# Patient Record
Sex: Female | Born: 1970 | Race: White | Hispanic: No | Marital: Married | Smoking: Never smoker
Health system: Southern US, Community
[De-identification: ages and names within clinical notes are randomized; demographics above are authoritative.]

---

## 1999-01-31 ENCOUNTER — Other Ambulatory Visit: Admission: RE | Admit: 1999-01-31 | Discharge: 1999-01-31 | Payer: Self-pay | Admitting: Obstetrics and Gynecology

## 1999-11-07 ENCOUNTER — Other Ambulatory Visit: Admission: RE | Admit: 1999-11-07 | Discharge: 1999-11-07 | Payer: Self-pay | Admitting: Obstetrics and Gynecology

## 2000-06-01 ENCOUNTER — Inpatient Hospital Stay (HOSPITAL_COMMUNITY): Admission: AD | Admit: 2000-06-01 | Discharge: 2000-06-03 | Payer: Self-pay | Admitting: Obstetrics and Gynecology

## 2000-07-17 ENCOUNTER — Other Ambulatory Visit: Admission: RE | Admit: 2000-07-17 | Discharge: 2000-07-17 | Payer: Self-pay | Admitting: Obstetrics and Gynecology

## 2001-08-10 ENCOUNTER — Other Ambulatory Visit: Admission: RE | Admit: 2001-08-10 | Discharge: 2001-08-10 | Payer: Self-pay | Admitting: Obstetrics and Gynecology

## 2002-08-26 ENCOUNTER — Other Ambulatory Visit: Admission: RE | Admit: 2002-08-26 | Discharge: 2002-08-26 | Payer: Self-pay | Admitting: Obstetrics and Gynecology

## 2003-09-21 ENCOUNTER — Other Ambulatory Visit: Admission: RE | Admit: 2003-09-21 | Discharge: 2003-09-21 | Payer: Self-pay | Admitting: Obstetrics and Gynecology

## 2004-12-04 ENCOUNTER — Other Ambulatory Visit: Admission: RE | Admit: 2004-12-04 | Discharge: 2004-12-04 | Payer: Self-pay | Admitting: Obstetrics and Gynecology

## 2006-01-08 ENCOUNTER — Other Ambulatory Visit: Admission: RE | Admit: 2006-01-08 | Discharge: 2006-01-08 | Payer: Self-pay | Admitting: Obstetrics and Gynecology

## 2006-08-11 ENCOUNTER — Ambulatory Visit (HOSPITAL_BASED_OUTPATIENT_CLINIC_OR_DEPARTMENT_OTHER): Admission: RE | Admit: 2006-08-11 | Discharge: 2006-08-11 | Payer: Self-pay | Admitting: Otolaryngology

## 2006-08-16 ENCOUNTER — Ambulatory Visit: Payer: Self-pay | Admitting: Internal Medicine

## 2007-03-24 ENCOUNTER — Ambulatory Visit: Payer: Self-pay | Admitting: Oncology

## 2009-10-12 ENCOUNTER — Ambulatory Visit: Payer: Self-pay | Admitting: Diagnostic Radiology

## 2009-10-12 ENCOUNTER — Emergency Department (HOSPITAL_BASED_OUTPATIENT_CLINIC_OR_DEPARTMENT_OTHER): Admission: EM | Admit: 2009-10-12 | Discharge: 2009-10-12 | Payer: Self-pay | Admitting: Emergency Medicine

## 2011-02-26 LAB — DIFFERENTIAL
Basophils Absolute: 0 10*3/uL (ref 0.0–0.1)
Eosinophils Absolute: 0.1 10*3/uL (ref 0.0–0.7)
Eosinophils Relative: 2 % (ref 0–5)
Lymphocytes Relative: 28 % (ref 12–46)

## 2011-02-26 LAB — BASIC METABOLIC PANEL
BUN: 25 mg/dL — ABNORMAL HIGH (ref 6–23)
CO2: 26 mEq/L (ref 19–32)
Calcium: 9.6 mg/dL (ref 8.4–10.5)
GFR calc Af Amer: >60 mL/min (ref >60)
Potassium: 3.5 mEq/L (ref 3.5–5.1)

## 2011-02-26 LAB — URINALYSIS, ROUTINE W REFLEX MICROSCOPIC
Glucose, UA: NEGATIVE mg/dL
Ketones, ur: NEGATIVE mg/dL
Leukocytes, UA: NEGATIVE
Protein, ur: NEGATIVE mg/dL

## 2011-02-26 LAB — URINE MICROSCOPIC-ADD ON

## 2011-02-26 LAB — POCT CARDIAC MARKERS
CKMB, poc: <1 ng/mL — ABNORMAL LOW (ref 1.0–8.0)
Myoglobin, poc: 67.6 ng/mL (ref 12–200)
Troponin i, poc: <0.05 ng/mL (ref 0.00–0.09)

## 2011-02-26 LAB — CBC
MCHC: 34.8 g/dL (ref 30.0–36.0)
MCV: 92.3 fL (ref 78.0–100.0)

## 2011-04-11 NOTE — Procedures (Signed)
Rebecca Guerrero, Rebecca Guerrero                  ACCOUNT NO.:  [id number]   MEDICAL RECORD NO.:  [id number]          PATIENT TYPE:  OUT   LOCATION:  SLEEP CENTER                 FACILITY:  [facility]   PHYSICIAN:  Clinton D. Maple Hudson, MD, FCCP, FACPDATE OF BIRTH:  04-05-70   DATE OF STUDY:  08/11/2006                              NOCTURNAL POLYSOMNOGRAM   REFERRING PHYSICIAN:  Lucky Cowboy, MD   INDICATION FOR STUDY:  Hypersomnia with sleep apnea.   EPWORTH SLEEPINESS SCORE:  5/24.  BMI 30.  Weight 205 pounds.   HOME MEDICATIONS:  Zegerid.   SLEEP ARCHITECTURE:  Total sleep time 384 minutes with sleep efficiency 81%.  Stage I was 3%, stage II 66%, stages III and 15%, REM 15% of total sleep  time.  Sleep latency 8 minutes, REM latency 155 minutes, awake after sleep  onset 83 minutes, arousal index 12.  No bedtime medication was reported.   RESPIRATORY DATA:  Split study protocol.  Apnea/hypopnea index (HPI, RDI) 62  obstructive events per hour, indicating severe obstructive sleep  apnea/Hypopnea syndrome before CPAP.  This included 80 obstructive apneas  and 60 hypopneas before CPAP.  Events were not positional.  REM AHI 0 per  hour.  CPAP was titrated to 11 CWP AHI 1.3 per hour.  A small Respironics  Comfort Gel mask was used.   OXYGEN DATA:  Loud snoring with oxygen desaturation to a nadir of 89% before  CPAP.  After CPAP control, saturation held 96-97% on room air.   CARDIAC DATA:  Normal sinus rhythm.   MOVEMENTS/PARASOMNIA:  Frequent limb jerks, but very few were associated  with arousal or awakening, clinically insignificant.   IMPRESSION/RECOMMENDATION:  1. Severe obstructive sleep apnea/hypopnea syndrome, AHI 62 per hour, with      nonpositional events, loud snoring and oxygen desaturation to a nadir      of 89%.  2. Successful CPAP titration to 11 CWP, AHI 1.3 per hour.  A small      Respironics Comfort Gel mask was used with heated humidifier.      Clinton D. Maple Hudson, MD, [facility], FACP  Diplomate, Biomedical engineer of Sleep Medicine  Electronically Signed     CDY/MEDQ  D:  08/16/2006 11:25:39  T:  08/18/2006 13:00:56  Job:  [id number]

## 2011-06-13 ENCOUNTER — Encounter: Payer: BC Managed Care – PPO | Admitting: Genetic Counselor

## 2013-06-15 ENCOUNTER — Other Ambulatory Visit: Payer: Self-pay | Admitting: Obstetrics and Gynecology

## 2013-06-15 DIAGNOSIS — R928 Other abnormal and inconclusive findings on diagnostic imaging of breast: Secondary | ICD-10-CM

## 2013-06-22 ENCOUNTER — Other Ambulatory Visit: Payer: Self-pay | Admitting: Obstetrics and Gynecology

## 2013-06-22 DIAGNOSIS — Z803 Family history of malignant neoplasm of breast: Secondary | ICD-10-CM

## 2013-06-30 ENCOUNTER — Ambulatory Visit
Admission: RE | Admit: 2013-06-30 | Discharge: 2013-06-30 | Disposition: A | Discharge status: Home / Self Care | Payer: BC Managed Care – PPO | Source: Ambulatory Visit | Attending: Obstetrics and Gynecology | Admitting: Obstetrics and Gynecology

## 2013-06-30 ENCOUNTER — Other Ambulatory Visit: Payer: BC Managed Care – PPO

## 2013-06-30 DIAGNOSIS — R928 Other abnormal and inconclusive findings on diagnostic imaging of breast: Secondary | ICD-10-CM

## 2013-07-10 ENCOUNTER — Ambulatory Visit
Admission: RE | Admit: 2013-07-10 | Discharge: 2013-07-10 | Disposition: A | Discharge status: Home / Self Care | Payer: BC Managed Care – PPO | Source: Ambulatory Visit | Attending: Obstetrics and Gynecology | Admitting: Obstetrics and Gynecology

## 2013-07-10 ENCOUNTER — Other Ambulatory Visit: Payer: BC Managed Care – PPO

## 2013-07-10 DIAGNOSIS — Z803 Family history of malignant neoplasm of breast: Secondary | ICD-10-CM

## 2013-07-10 MED ORDER — GADOBENATE DIMEGLUMINE 529 MG/ML IV SOLN
20.0000 mL | Freq: Once | INTRAVENOUS | Status: AC | PRN
  Administered 2013-07-10: 20 mL via INTRAVENOUS

## 2013-07-12 ENCOUNTER — Other Ambulatory Visit: Payer: Self-pay | Admitting: Obstetrics and Gynecology

## 2013-07-12 DIAGNOSIS — R928 Other abnormal and inconclusive findings on diagnostic imaging of breast: Secondary | ICD-10-CM

## 2013-07-18 ENCOUNTER — Other Ambulatory Visit: Payer: Self-pay | Admitting: Obstetrics and Gynecology

## 2013-07-18 ENCOUNTER — Ambulatory Visit
Admission: RE | Admit: 2013-07-18 | Discharge: 2013-07-18 | Disposition: A | Discharge status: Home / Self Care | Payer: BC Managed Care – PPO | Source: Ambulatory Visit | Attending: Obstetrics and Gynecology | Admitting: Obstetrics and Gynecology

## 2013-07-18 DIAGNOSIS — R928 Other abnormal and inconclusive findings on diagnostic imaging of breast: Secondary | ICD-10-CM

## 2013-07-20 ENCOUNTER — Other Ambulatory Visit: Payer: Self-pay | Admitting: Obstetrics and Gynecology

## 2013-07-20 ENCOUNTER — Ambulatory Visit
Admission: RE | Admit: 2013-07-20 | Discharge: 2013-07-20 | Disposition: A | Discharge status: Home / Self Care | Payer: BC Managed Care – PPO | Source: Ambulatory Visit | Attending: Obstetrics and Gynecology | Admitting: Obstetrics and Gynecology

## 2013-07-20 ENCOUNTER — Other Ambulatory Visit (HOSPITAL_COMMUNITY): Payer: Self-pay | Admitting: Radiology

## 2013-07-20 DIAGNOSIS — R928 Other abnormal and inconclusive findings on diagnostic imaging of breast: Secondary | ICD-10-CM

## 2013-07-28 ENCOUNTER — Other Ambulatory Visit: Payer: BC Managed Care – PPO

## 2014-11-17 ENCOUNTER — Emergency Department (HOSPITAL_BASED_OUTPATIENT_CLINIC_OR_DEPARTMENT_OTHER)
Admission: EM | Admit: 2014-11-17 | Discharge: 2014-11-17 | Disposition: A | Discharge status: Home / Self Care | Payer: BC Managed Care – PPO | Attending: Emergency Medicine | Admitting: Emergency Medicine

## 2014-11-17 ENCOUNTER — Encounter (HOSPITAL_BASED_OUTPATIENT_CLINIC_OR_DEPARTMENT_OTHER): Payer: Self-pay | Admitting: Emergency Medicine

## 2014-11-17 DIAGNOSIS — R350 Frequency of micturition: Secondary | ICD-10-CM | POA: Diagnosis present

## 2014-11-17 DIAGNOSIS — N390 Urinary tract infection, site not specified: Secondary | ICD-10-CM | POA: Diagnosis not present

## 2014-11-17 DIAGNOSIS — Z3202 Encounter for pregnancy test, result negative: Secondary | ICD-10-CM | POA: Insufficient documentation

## 2014-11-17 LAB — URINALYSIS, ROUTINE W REFLEX MICROSCOPIC
BILIRUBIN URINE: NEGATIVE
Glucose, UA: NEGATIVE mg/dL
KETONES UR: NEGATIVE mg/dL
NITRITE: NEGATIVE
PH: 5.5 (ref 5.0–8.0)
Protein, ur: 100 mg/dL — AB
SPECIFIC GRAVITY, URINE: 1.03 (ref 1.005–1.030)
UROBILINOGEN UA: 0.2 mg/dL (ref 0.0–1.0)

## 2014-11-17 LAB — URINE MICROSCOPIC-ADD ON

## 2014-11-17 LAB — PREGNANCY, URINE: Preg Test, Ur: NEGATIVE

## 2014-11-17 MED ORDER — LIDOCAINE HCL (PF) 1 % IJ SOLN
INTRAMUSCULAR | Status: AC
  Administered 2014-11-17: 2 mL
  Filled 2014-11-17: qty 5

## 2014-11-17 MED ORDER — CEFTRIAXONE SODIUM 1 G IJ SOLR
1.0000 g | Freq: Once | INTRAMUSCULAR | Status: AC
  Administered 2014-11-17: 1 g via INTRAMUSCULAR
  Filled 2014-11-17: qty 10

## 2014-11-17 MED ORDER — CEPHALEXIN 500 MG PO CAPS: 500.0000 mg | ORAL_CAPSULE | Freq: Four times a day (QID) | ORAL | Status: DC

## 2014-11-17 NOTE — ED Notes (Signed)
MD at bedside discussing results with patient 

## 2014-11-17 NOTE — ED Notes (Signed)
Patient states that she woke up at 5 am with bladder spasms and urinary frequency

## 2014-11-17 NOTE — Discharge Instructions (Signed)
Take keflex four times a day for a week.   Take tylenol, motrin for pain.   Stay hydrated.   Follow up with your doctor.   Return to ER if you have severe pain, vomiting, fevers.

## 2014-11-17 NOTE — ED Provider Notes (Signed)
CSN: [id number]     Arrival date & time 11/17/14  0645 History   First MD Initiated Contact with Patient 11/17/14 [phone]     Chief Complaint  Patient presents with  . Urinary Frequency     (Consider location/radiation/quality/duration/timing/severity/associated sxs/prior Treatment) The history is provided by the patient.  DEJAI SCHUBACH is a 43 y.o. female here with bladder spasms and urinary frequency. Woke up this morning around 5am with urinary frequency. Denies dysuria or fevers or flank pain. However, has spasms around her bladder area. Denies vomiting. Has hx of UTIs in the past.    History reviewed. No pertinent past medical history. History reviewed. No pertinent past surgical history. History reviewed. No pertinent family history. History  Substance Use Topics  . Smoking status: Never Smoker   . Smokeless tobacco: Not on file  . Alcohol Use: No   OB History    No data available     Review of Systems  Genitourinary: Positive for frequency.  All other systems reviewed and are negative.     Allergies  Codeine  Home Medications   Prior to Admission medications   Not on File   BP 140/77 mmHg  Pulse 82  Temp(Src) 98.4 F (36.9 C) (Oral)  Resp 16  Wt 214 lb (97.07 kg)  SpO2 100%  LMP 10/23/2014 (Exact Date) Physical Exam  Constitutional: She is oriented to person, place, and time.  Slightly uncomfortable   HENT:  Head: Normocephalic.  Mouth/Throat: Oropharynx is clear and moist.  Eyes: Conjunctivae are normal. Pupils are equal, round, and reactive to light.  Neck: Normal range of motion. Neck supple.  Cardiovascular: Normal rate, regular rhythm and normal heart sounds.   Pulmonary/Chest: Effort normal and breath sounds normal. No respiratory distress. She has no wheezes. She has no rales.  Abdominal: Soft. Bowel sounds are normal.  Mild suprapubic tenderness, no rebound   Musculoskeletal: Normal range of motion. She exhibits no edema or tenderness.   Neurological: She is alert and oriented to person, place, and time. No cranial nerve deficit. Coordination normal.  Skin: Skin is warm and dry.  Psychiatric: She has a normal mood and affect. Her behavior is normal. Judgment and thought content normal.  Nursing note and vitals reviewed.   ED Course  Procedures (including critical care time) Labs Review Labs Reviewed  URINALYSIS, ROUTINE W REFLEX MICROSCOPIC - Abnormal; Notable for the following:    APPearance TURBID (*)    Hgb urine dipstick LARGE (*)    Protein, ur 100 (*)    Leukocytes, UA LARGE (*)    All other components within normal limits  URINE MICROSCOPIC-ADD ON - Abnormal; Notable for the following:    Squamous Epithelial / LPF FEW (*)    Bacteria, UA MANY (*)    All other components within normal limits  PREGNANCY, URINE    Imaging Review No results found.   EKG Interpretation None      MDM   Final diagnoses:  None   Shalaina SARAHBETH CASHIN is a 43 y.o. female here with lower ab pain, dysuria. Likely UTI , no signs of pyelo and doesn't appear septic.   7:51 AM UA showed large leuks with many bacteria. Given ceftriaxone IM. Will d/c home with keflex.    Wandra Arthurs, MD 11/17/14 [phone]

## 2014-11-18 LAB — URINE CULTURE

## 2015-05-14 ENCOUNTER — Other Ambulatory Visit: Payer: Self-pay | Admitting: Obstetrics and Gynecology

## 2015-05-14 DIAGNOSIS — N63 Unspecified lump in unspecified breast: Secondary | ICD-10-CM

## 2015-05-18 ENCOUNTER — Ambulatory Visit
Admission: RE | Admit: 2015-05-18 | Discharge: 2015-05-18 | Disposition: A | Discharge status: Home / Self Care | Payer: BLUE CROSS/BLUE SHIELD | Source: Ambulatory Visit | Attending: Obstetrics and Gynecology | Admitting: Obstetrics and Gynecology

## 2015-05-18 DIAGNOSIS — N63 Unspecified lump in unspecified breast: Secondary | ICD-10-CM

## 2015-08-01 ENCOUNTER — Other Ambulatory Visit: Payer: Self-pay | Admitting: Obstetrics and Gynecology

## 2015-08-02 LAB — CYTOLOGY - PAP

## 2015-08-08 ENCOUNTER — Other Ambulatory Visit: Payer: Self-pay | Admitting: Obstetrics and Gynecology

## 2016-01-07 DIAGNOSIS — F419 Anxiety disorder, unspecified: Secondary | ICD-10-CM | POA: Insufficient documentation

## 2016-01-07 DIAGNOSIS — F411 Generalized anxiety disorder: Secondary | ICD-10-CM | POA: Insufficient documentation

## 2016-01-07 DIAGNOSIS — R3129 Other microscopic hematuria: Secondary | ICD-10-CM | POA: Insufficient documentation

## 2016-01-07 DIAGNOSIS — C44311 Basal cell carcinoma of skin of nose: Secondary | ICD-10-CM | POA: Insufficient documentation

## 2016-08-29 ENCOUNTER — Other Ambulatory Visit: Payer: Self-pay | Admitting: Obstetrics and Gynecology

## 2016-08-29 DIAGNOSIS — Z803 Family history of malignant neoplasm of breast: Secondary | ICD-10-CM

## 2016-09-02 ENCOUNTER — Ambulatory Visit
Admission: RE | Admit: 2016-09-02 | Discharge: 2016-09-02 | Disposition: A | Discharge status: Home / Self Care | Payer: 59 | Source: Ambulatory Visit | Attending: Obstetrics and Gynecology | Admitting: Obstetrics and Gynecology

## 2016-09-02 DIAGNOSIS — Z803 Family history of malignant neoplasm of breast: Secondary | ICD-10-CM

## 2016-09-02 MED ORDER — GADOBENATE DIMEGLUMINE 529 MG/ML IV SOLN
20.0000 mL | Freq: Once | INTRAVENOUS | Status: AC | PRN
  Administered 2016-09-02: 20 mL via INTRAVENOUS

## 2016-10-21 DIAGNOSIS — I10 Essential (primary) hypertension: Secondary | ICD-10-CM | POA: Insufficient documentation

## 2016-10-21 DIAGNOSIS — R9431 Abnormal electrocardiogram [ECG] [EKG]: Secondary | ICD-10-CM | POA: Insufficient documentation

## 2017-10-08 ENCOUNTER — Other Ambulatory Visit: Payer: Self-pay | Admitting: Obstetrics and Gynecology

## 2017-10-08 DIAGNOSIS — R928 Other abnormal and inconclusive findings on diagnostic imaging of breast: Secondary | ICD-10-CM

## 2017-10-12 ENCOUNTER — Ambulatory Visit
Admission: RE | Admit: 2017-10-12 | Discharge: 2017-10-12 | Disposition: A | Discharge status: Home / Self Care | Payer: 59 | Source: Ambulatory Visit | Attending: Obstetrics and Gynecology | Admitting: Obstetrics and Gynecology

## 2017-10-12 DIAGNOSIS — R928 Other abnormal and inconclusive findings on diagnostic imaging of breast: Secondary | ICD-10-CM

## 2018-04-28 DIAGNOSIS — E6609 Other obesity due to excess calories: Secondary | ICD-10-CM | POA: Insufficient documentation

## 2018-04-28 DIAGNOSIS — R1319 Other dysphagia: Secondary | ICD-10-CM | POA: Insufficient documentation

## 2018-07-27 DIAGNOSIS — K2100 Gastro-esophageal reflux disease with esophagitis, without bleeding: Secondary | ICD-10-CM | POA: Insufficient documentation

## 2018-10-13 ENCOUNTER — Other Ambulatory Visit: Payer: Self-pay | Admitting: Obstetrics and Gynecology

## 2018-10-13 DIAGNOSIS — Z803 Family history of malignant neoplasm of breast: Secondary | ICD-10-CM

## 2018-11-05 ENCOUNTER — Ambulatory Visit
Admission: RE | Admit: 2018-11-05 | Discharge: 2018-11-05 | Disposition: A | Discharge status: Home / Self Care | Payer: 59 | Source: Ambulatory Visit | Attending: Obstetrics and Gynecology | Admitting: Obstetrics and Gynecology

## 2018-11-05 DIAGNOSIS — Z803 Family history of malignant neoplasm of breast: Secondary | ICD-10-CM

## 2018-11-05 MED ORDER — GADOBUTROL 1 MMOL/ML IV SOLN
10.0000 mL | Freq: Once | INTRAVENOUS | Status: AC | PRN
  Administered 2018-11-05: 10 mL via INTRAVENOUS

## 2018-11-10 ENCOUNTER — Other Ambulatory Visit: Payer: Self-pay | Admitting: Obstetrics and Gynecology

## 2018-11-10 DIAGNOSIS — K7689 Other specified diseases of liver: Secondary | ICD-10-CM

## 2018-11-11 ENCOUNTER — Ambulatory Visit
Admission: RE | Admit: 2018-11-11 | Discharge: 2018-11-11 | Disposition: A | Discharge status: Home / Self Care | Payer: 59 | Source: Ambulatory Visit | Attending: Obstetrics and Gynecology | Admitting: Obstetrics and Gynecology

## 2018-11-11 DIAGNOSIS — K7689 Other specified diseases of liver: Secondary | ICD-10-CM

## 2019-11-09 DIAGNOSIS — G43909 Migraine, unspecified, not intractable, without status migrainosus: Secondary | ICD-10-CM | POA: Insufficient documentation

## 2019-11-09 DIAGNOSIS — N390 Urinary tract infection, site not specified: Secondary | ICD-10-CM | POA: Insufficient documentation

## 2019-11-09 DIAGNOSIS — I10 Essential (primary) hypertension: Secondary | ICD-10-CM | POA: Insufficient documentation

## 2019-11-15 ENCOUNTER — Other Ambulatory Visit: Payer: Self-pay | Admitting: Obstetrics and Gynecology

## 2019-11-15 DIAGNOSIS — Z9189 Other specified personal risk factors, not elsewhere classified: Secondary | ICD-10-CM

## 2020-03-01 IMAGING — US US ABDOMEN LIMITED
1 series · 14 of 25 positions shown · non-contrast
Comparison: Breast MRI November 05, 2018 which included a portion
of the upper abdomen

CLINICAL DATA: Liver cyst

EXAM:
ULTRASOUND ABDOMEN LIMITED RIGHT UPPER QUADRANT

[Series 1: us abdomen limited · 0.30mm/px · 14 of 56 slices shown]
[im 1/56]
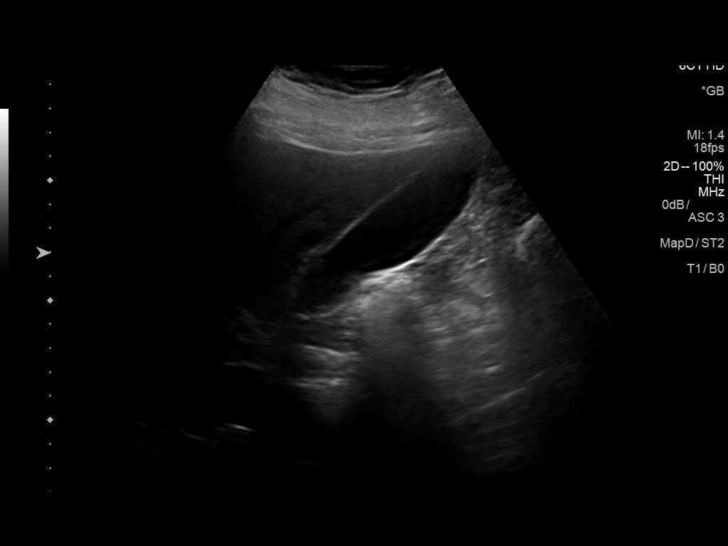
[im 5/56]
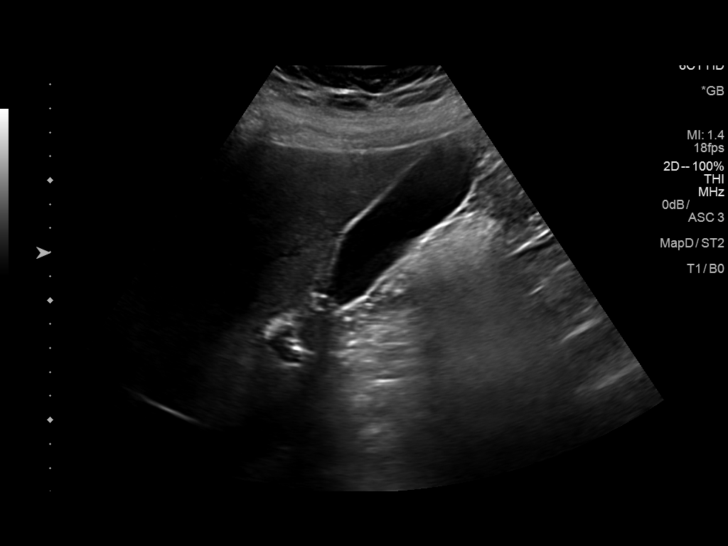
[im 10/56]
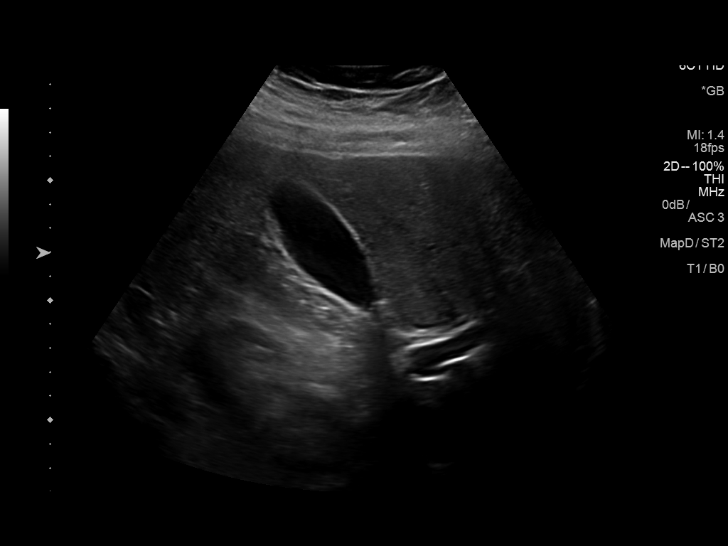
[im 14/56]
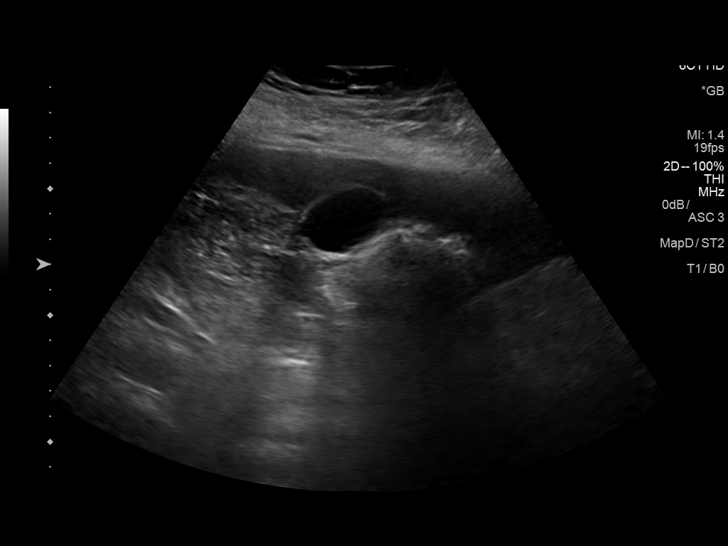
[im 19/56]
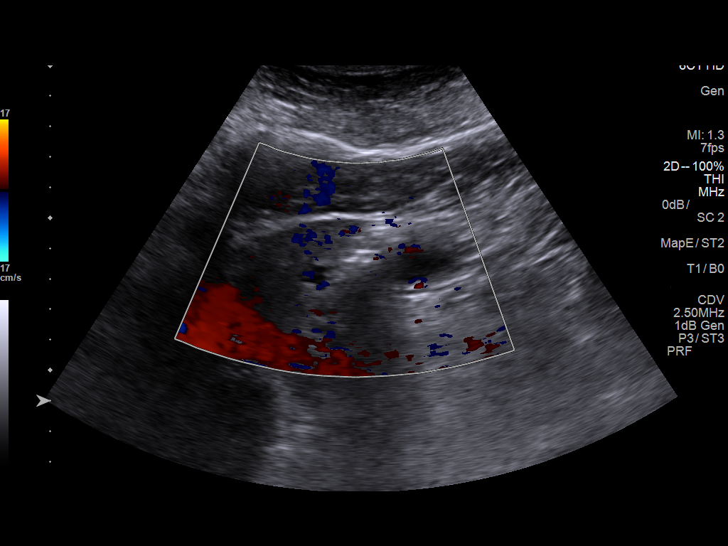
[im 21/56]
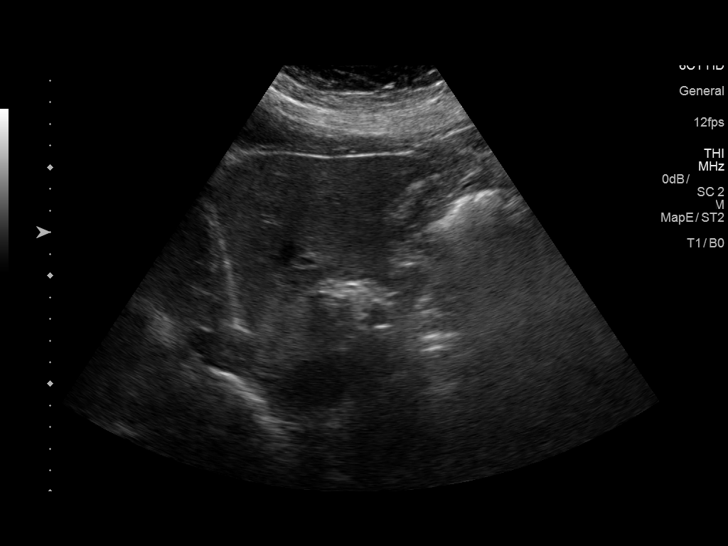
[im 26/56]
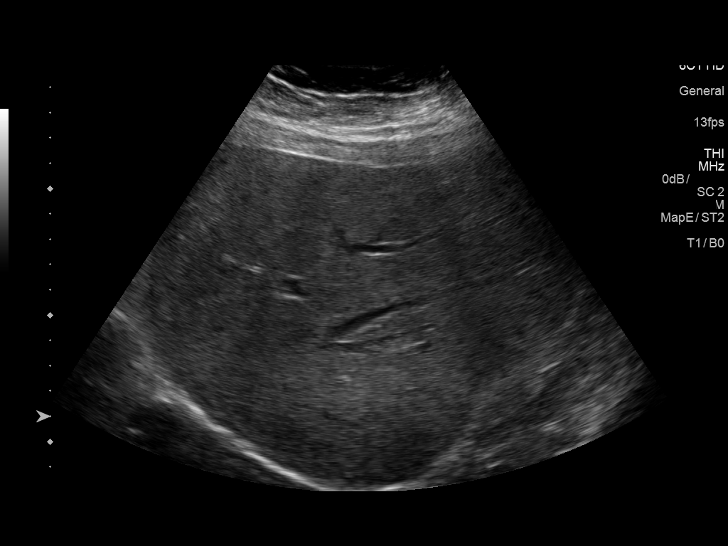
[im 30/56]
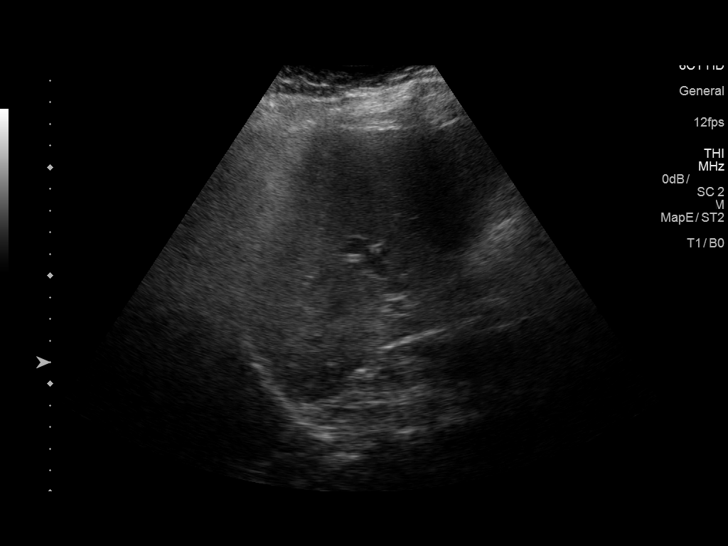
[im 35/56]
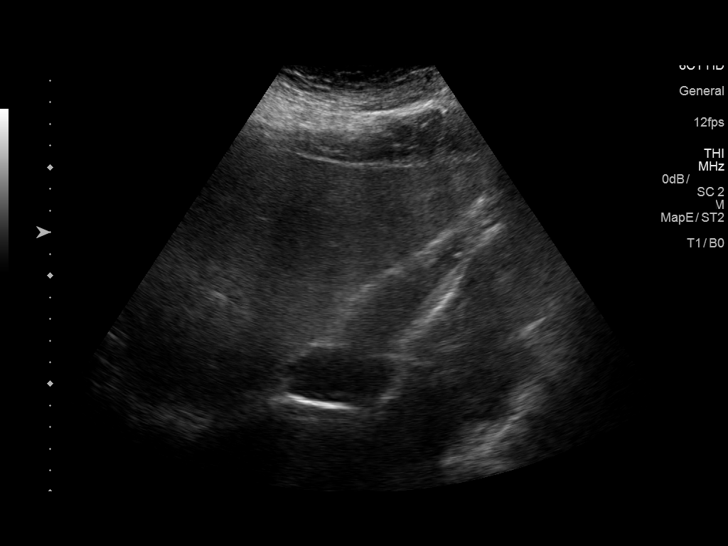
[im 37/56]
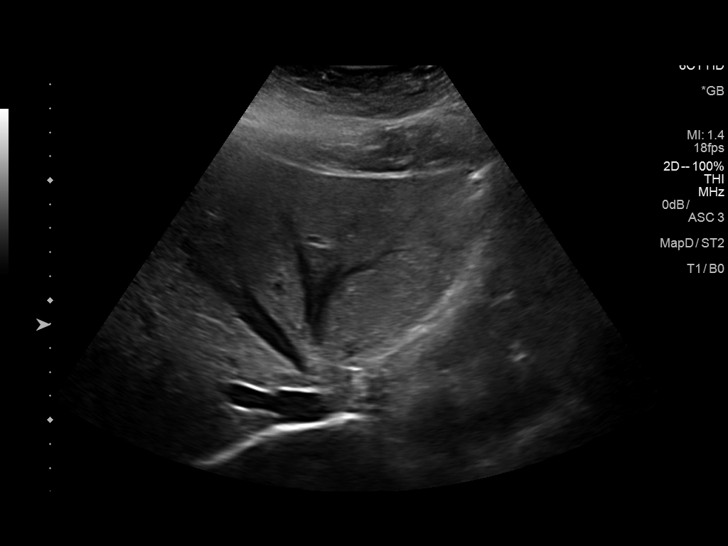
[im 42/56]
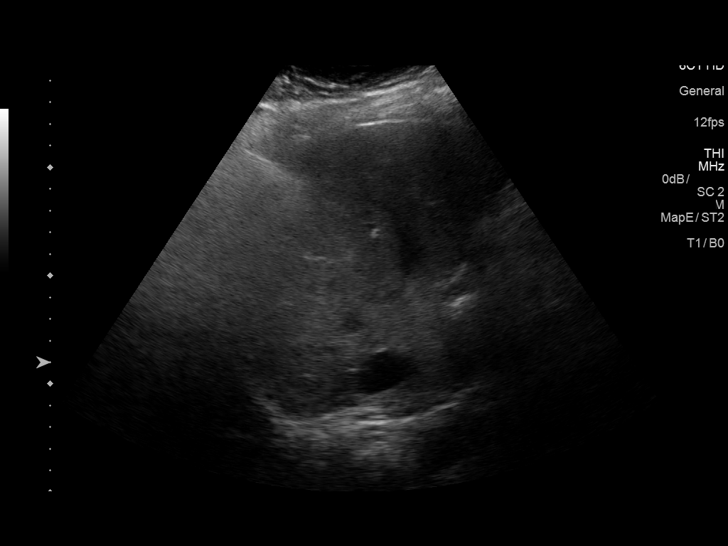
[im 46/56]
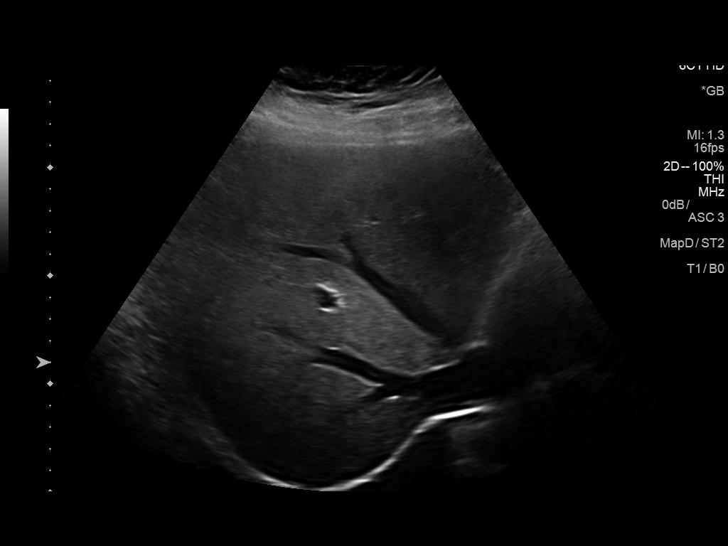
[im 51/56]
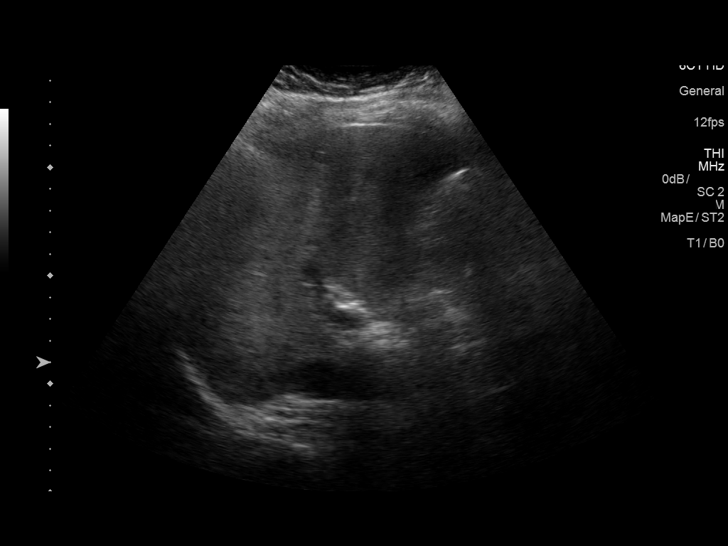
[im 56/56]
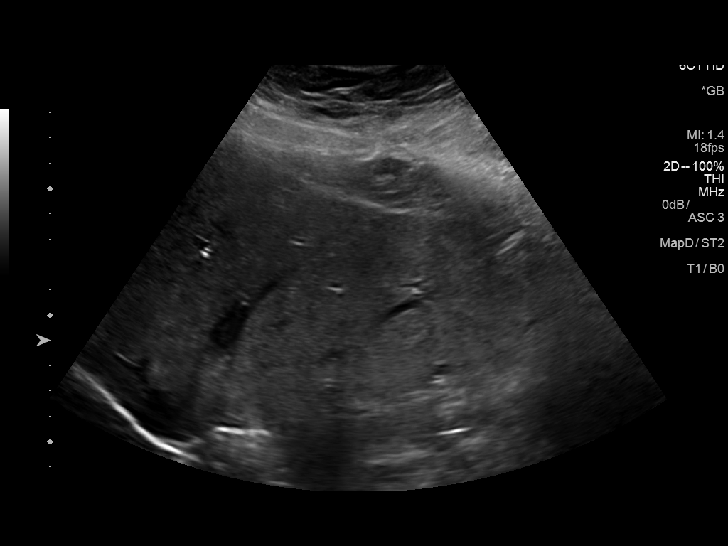

[14 of 25 positions shown; findings below may reference images not displayed]

FINDINGS: Gallbladder:

No gallstones or wall thickening visualized. There is no
pericholecystic fluid. No sonographic Murphy sign noted by
sonographer.

Common bile duct:

Diameter: 4 mm. No intrahepatic or extrahepatic biliary duct
dilatation.

Liver:

There is a cyst in the left lobe of the liver measuring 0.8 x 0.9 x
1.0 cm. No other focal liver lesions are appreciable. Within normal
limits in parenchymal echogenicity. Portal vein is patent on color
Doppler imaging with normal direction of blood flow towards the
liver.
IMPRESSION: Small benign cyst in the left lobe of the liver. Liver otherwise
appears unremarkable.

Study otherwise unremarkable.

## 2020-06-21 DIAGNOSIS — N951 Menopausal and female climacteric states: Secondary | ICD-10-CM | POA: Insufficient documentation

## 2020-09-20 DIAGNOSIS — F4320 Adjustment disorder, unspecified: Secondary | ICD-10-CM | POA: Insufficient documentation

## 2021-01-03 DIAGNOSIS — N390 Urinary tract infection, site not specified: Secondary | ICD-10-CM | POA: Insufficient documentation

## 2021-05-07 ENCOUNTER — Other Ambulatory Visit: Payer: Self-pay | Admitting: Obstetrics and Gynecology

## 2021-05-07 DIAGNOSIS — Z9189 Other specified personal risk factors, not elsewhere classified: Secondary | ICD-10-CM

## 2021-05-23 ENCOUNTER — Ambulatory Visit
Admission: RE | Admit: 2021-05-23 | Discharge: 2021-05-23 | Disposition: A | Discharge status: Home / Self Care | Payer: BC Managed Care – PPO | Source: Ambulatory Visit | Attending: Obstetrics and Gynecology | Admitting: Obstetrics and Gynecology

## 2021-05-23 ENCOUNTER — Other Ambulatory Visit: Payer: Self-pay

## 2021-05-23 DIAGNOSIS — Z9189 Other specified personal risk factors, not elsewhere classified: Secondary | ICD-10-CM

## 2021-05-23 MED ORDER — GADOBUTROL 1 MMOL/ML IV SOLN
10.0000 mL | Freq: Once | INTRAVENOUS | Status: AC | PRN
  Administered 2021-05-23: 10 mL via INTRAVENOUS

## 2021-07-16 DIAGNOSIS — G4733 Obstructive sleep apnea (adult) (pediatric): Secondary | ICD-10-CM | POA: Insufficient documentation

## 2022-01-15 DIAGNOSIS — M26629 Arthralgia of temporomandibular joint, unspecified side: Secondary | ICD-10-CM | POA: Insufficient documentation

## 2022-02-11 ENCOUNTER — Other Ambulatory Visit: Payer: Self-pay | Admitting: Obstetrics and Gynecology

## 2022-02-11 DIAGNOSIS — Z803 Family history of malignant neoplasm of breast: Secondary | ICD-10-CM

## 2022-07-04 DIAGNOSIS — D508 Other iron deficiency anemias: Secondary | ICD-10-CM | POA: Insufficient documentation

## 2022-07-04 DIAGNOSIS — R7301 Impaired fasting glucose: Secondary | ICD-10-CM | POA: Insufficient documentation

## 2022-12-17 ENCOUNTER — Inpatient Hospital Stay: Payer: No Typology Code available for payment source | Attending: Hematology & Oncology

## 2022-12-17 ENCOUNTER — Encounter: Payer: Self-pay | Admitting: Hematology & Oncology

## 2022-12-17 ENCOUNTER — Inpatient Hospital Stay: Payer: No Typology Code available for payment source | Admitting: Hematology & Oncology

## 2022-12-17 ENCOUNTER — Other Ambulatory Visit: Payer: Self-pay

## 2022-12-17 VITALS — BP 127/71 | HR 77 | Temp 98.3°F | Resp 18 | Ht 69.0 in | Wt 247.0 lb

## 2022-12-17 DIAGNOSIS — D500 Iron deficiency anemia secondary to blood loss (chronic): Secondary | ICD-10-CM

## 2022-12-17 DIAGNOSIS — D509 Iron deficiency anemia, unspecified: Secondary | ICD-10-CM | POA: Diagnosis not present

## 2022-12-17 LAB — CMP (CANCER CENTER ONLY)
ALT: 14 U/L (ref 0–44)
AST: 15 U/L (ref 15–41)
Albumin: 4.4 g/dL (ref 3.5–5.0)
Alkaline Phosphatase: 72 U/L (ref 38–126)
Anion gap: 9 (ref 5–15)
BUN: 23 mg/dL — ABNORMAL HIGH (ref 6–20)
CO2: 25 mmol/L (ref 22–32)
Calcium: 9.3 mg/dL (ref 8.9–10.3)
Chloride: 105 mmol/L (ref 98–111)
Creatinine: 1.04 mg/dL — ABNORMAL HIGH (ref 0.44–1.00)
GFR, Estimated: >60 mL/min (ref >60)
Glucose, Bld: 97 mg/dL (ref 70–99)
Potassium: 3.7 mmol/L (ref 3.5–5.1)
Sodium: 139 mmol/L (ref 135–145)
Total Bilirubin: 0.3 mg/dL (ref 0.3–1.2)
Total Protein: 6.4 g/dL — ABNORMAL LOW (ref 6.5–8.1)

## 2022-12-17 LAB — CBC WITH DIFFERENTIAL (CANCER CENTER ONLY)
Abs Immature Granulocytes: 0.07 10*3/uL (ref 0.00–0.07)
Basophils Absolute: 0 10*3/uL (ref 0.0–0.1)
Basophils Relative: 1 %
Eosinophils Absolute: 0.1 10*3/uL (ref 0.0–0.5)
Eosinophils Relative: 2 %
HCT: 34.9 % — ABNORMAL LOW (ref 36.0–46.0)
Hemoglobin: 11 g/dL — ABNORMAL LOW (ref 12.0–15.0)
Immature Granulocytes: 1 %
Lymphocytes Relative: 26 %
Lymphs Abs: 1.4 10*3/uL (ref 0.7–4.0)
MCH: 26.2 pg (ref 26.0–34.0)
MCHC: 31.5 g/dL (ref 30.0–36.0)
MCV: 83.1 fL (ref 80.0–100.0)
Monocytes Absolute: 0.3 10*3/uL (ref 0.1–1.0)
Monocytes Relative: 6 %
Neutro Abs: 3.6 10*3/uL (ref 1.7–7.7)
Neutrophils Relative %: 64 %
Platelet Count: 339 10*3/uL (ref 150–400)
RBC: 4.2 MIL/uL (ref 3.87–5.11)
RDW: 15.9 % — ABNORMAL HIGH (ref 11.5–15.5)
WBC Count: 5.6 10*3/uL (ref 4.0–10.5)
nRBC: 0 % (ref 0.0–0.2)

## 2022-12-17 LAB — RETICULOCYTES
Immature Retic Fract: 16.9 % — ABNORMAL HIGH (ref 2.3–15.9)
RBC.: 4.21 MIL/uL (ref 3.87–5.11)
Retic Count, Absolute: 66.5 10*3/uL (ref 19.0–186.0)
Retic Ct Pct: 1.6 % (ref 0.4–3.1)

## 2022-12-17 LAB — SAVE SMEAR(SSMR), FOR PROVIDER SLIDE REVIEW

## 2022-12-17 LAB — FERRITIN: Ferritin: 4 ng/mL — ABNORMAL LOW (ref 11–307)

## 2022-12-17 NOTE — Progress Notes (Signed)
Referral MD  Reason for Referral: Iron deficiency anemia-possible malabsorption versus bleeding  Chief Complaint  Patient presents with   New Patient (Initial Visit)  : My iron has been low.  HPI: Rebecca Guerrero is a very charming 52 year old white female.  She and I have a lot in common.  And she actually went to the same high school I did up in [place].  She graduated about 15 years after I did.  She and her husband now live in [place].  She has been followed at [facility].  She has had problems with anemia and iron deficiency.  She has been on chronic Prilosec for probably 8 years.  She still has her monthly cycles.  There is still quite regular.  The big news is that she is going expecting her first grandchild in July.  However, the daughter and her husband live in [place].  Hopefully, they will be able to go over there.  She has had multiple endoscopies and colonoscopies.  There have been anything that has been found that would suggest a malignancy.  I think she been tested for celiac disease.  This is also been unremarkable.  She has been taking oral iron.  She does not drink.  She does not smoke.  She really has had no surgeries.  She does not chew ice.  She does have little fatigue.  She has never had IV iron.  She is not a vegetarian.  Currently, I would say that her performance status is probably ECOG 1.   History reviewed. No pertinent past medical history.:  History reviewed. No pertinent surgical history.:   Current Outpatient Medications:    clonazePAM (KLONOPIN) 0.5 MG tablet, 0.5 mg daily., Disp: , Rfl:    estradiol (ESTRACE) 0.1 MG/GM vaginal cream, Place 1 Applicatorful vaginally. Every 3-4 days, Disp: , Rfl:    iron polysaccharides (NIFEREX) 150 MG capsule, Take 1 capsule by mouth every other day., Disp: , Rfl:    losartan-hydrochlorothiazide (HYZAAR) 50-12.5 MG tablet, Take 0.5 tablets by mouth 2 (two) times daily., Disp: , Rfl:    nitrofurantoin  (MACRODANTIN) 100 MG capsule, Take 100 mg by mouth. Monthly through the menstrual cycle, Disp: , Rfl:    omeprazole (PRILOSEC) 20 MG capsule, Take 20 mg by mouth daily., Disp: , Rfl:    sertraline (ZOLOFT) 50 MG tablet, Take 1 tablet by mouth daily., Disp: , Rfl: :  :   Allergies  Allergen Reactions   Codeine Other (See Comments)    Room spinning  :  History reviewed. No pertinent family history.:   Social History   Socioeconomic History   Marital status: Married    Spouse name: Not on file   Number of children: Not on file   Years of education: Not on file   Highest education level: Not on file  Occupational History   Not on file  Tobacco Use   Smoking status: Never   Smokeless tobacco: Not on file  Substance and Sexual Activity   Alcohol use: No   Drug use: No   Sexual activity: Not on file  Other Topics Concern   Not on file  Social History Narrative   Not on file   Social Determinants of Health   Financial Resource Strain: Not on file  Food Insecurity: Not on file  Transportation Needs: Not on file  Physical Activity: Not on file  Stress: Not on file  Social Connections: Not on file  Intimate Partner Violence: Not on file  :  Review of Systems  Constitutional:  Positive for malaise/fatigue.  HENT: Negative.    Eyes: Negative.   Respiratory: Negative.    Cardiovascular: Negative.   Gastrointestinal:  Positive for heartburn.  Genitourinary: Negative.   Musculoskeletal: Negative.   Skin: Negative.   Neurological: Negative.   Endo/Heme/Allergies: Negative.   Psychiatric/Behavioral: Negative.       Exam: Vital signs show temperature of 98.3.  Pulse 77.  Blood pressure 127/71.  Weight is 247 pounds.  '@IPVITALS'$ @ Physical Exam Vitals reviewed.  HENT:     Head: Normocephalic and atraumatic.  Eyes:     Pupils: Pupils are equal, round, and reactive to light.  Cardiovascular:     Rate and Rhythm: Normal rate and regular rhythm.     Heart sounds:  Normal heart sounds.  Pulmonary:     Effort: Pulmonary effort is normal.     Breath sounds: Normal breath sounds.  Abdominal:     General: Bowel sounds are normal.     Palpations: Abdomen is soft.  Musculoskeletal:        General: No tenderness or deformity. Normal range of motion.     Cervical back: Normal range of motion.  Lymphadenopathy:     Cervical: No cervical adenopathy.  Skin:    General: Skin is warm and dry.     Findings: No erythema or rash.  Neurological:     Mental Status: She is alert and oriented to person, place, and time.  Psychiatric:        Behavior: Behavior normal.        Thought Content: Thought content normal.        Judgment: Judgment normal.     Recent Labs    12/17/22 1359  WBC 5.6  HGB 11.0*  HCT 34.9*  PLT 339    Recent Labs    12/17/22 1359  NA 139  K 3.7  CL 105  CO2 25  GLUCOSE 97  BUN 23*  CREATININE 1.04*  CALCIUM 9.3    Blood smear review: She has known normochromic and normocytic red blood cells.  There is some anisocytosis and poikilocytosis.  There is no nucleated red blood cells.  I see no teardrop cells.  She has no target cells.  There is no rouleaux formation.  White blood cells.  Normal morphology maturation.  She has no immature myeloid or lymphoid cells.  There are no hypersegmented polys.  Platelets are adequate in number and size.  Pathology: None    Assessment and Plan: Rebecca Guerrero is a very charming 52 year old white female.  She has mild anemia.  She is little bit microcytic.  She has had low ferritin in the past.  Going back to 2021, her ferritin was only 6.  Just 2 months ago, the ferritin was 5.  I had to believe that part of the problem is that she is on Prilosec.  I think this is causing her to have achlorhydria and subsequently she does not absorb iron that she takes by mouth.  I think that she probably would respond to IV iron.  Hopefully, we can utilize Monoferric which would be very convenient for  her.  I do not think we need to do a bone marrow test.  Nothing on her blood smear looks like there is any underlying hematologic malignancy.  For right now, we will see what the iron studies look like.  She is incredibly delightful to talk to.  It was fun talking to her about [employer].  I likely will see her back in about a month.  I want to see patients back to get IV iron about a month later and we can see what changes are with her hematologic parameters.

## 2022-12-18 ENCOUNTER — Encounter: Payer: Self-pay | Admitting: Hematology & Oncology

## 2022-12-18 DIAGNOSIS — D500 Iron deficiency anemia secondary to blood loss (chronic): Secondary | ICD-10-CM | POA: Insufficient documentation

## 2022-12-18 LAB — IRON AND IRON BINDING CAPACITY (CC-WL,HP ONLY)
Iron: 66 ug/dL (ref 28–170)
Saturation Ratios: 15 % (ref 10.4–31.8)
TIBC: 441 ug/dL (ref 250–450)
UIBC: 375 ug/dL (ref 148–442)

## 2022-12-18 NOTE — Addendum Note (Signed)
Addended by: Volanda Napoleon on: 12/18/2022 05:14 PM   Modules accepted: Orders

## 2022-12-19 LAB — HGB FRACTIONATION CASCADE
Hgb A2: 2.6 % (ref 1.8–3.2)
Hgb A: 97.4 % (ref 96.4–98.8)
Hgb F: 0 % (ref 0.0–2.0)
Hgb S: 0 %

## 2022-12-19 LAB — ERYTHROPOIETIN: Erythropoietin: 37.2 m[IU]/mL — ABNORMAL HIGH (ref 2.6–18.5)

## 2022-12-22 ENCOUNTER — Other Ambulatory Visit: Payer: Self-pay | Admitting: Hematology & Oncology

## 2022-12-26 ENCOUNTER — Inpatient Hospital Stay: Payer: No Typology Code available for payment source | Attending: Hematology & Oncology

## 2022-12-26 VITALS — BP 115/67 | HR 63 | Temp 98.3°F | Resp 17

## 2022-12-26 DIAGNOSIS — D509 Iron deficiency anemia, unspecified: Secondary | ICD-10-CM | POA: Diagnosis present

## 2022-12-26 DIAGNOSIS — D500 Iron deficiency anemia secondary to blood loss (chronic): Secondary | ICD-10-CM

## 2022-12-26 MED ORDER — SODIUM CHLORIDE 0.9 % IV SOLN: Freq: Once | INTRAVENOUS | Status: AC

## 2022-12-26 MED ORDER — SODIUM CHLORIDE 0.9 % IV SOLN
300.0000 mg | Freq: Once | INTRAVENOUS | Status: AC
  Administered 2022-12-26: 300 mg via INTRAVENOUS
  Filled 2022-12-26: qty 300

## 2022-12-26 NOTE — Patient Instructions (Signed)

## 2023-01-02 ENCOUNTER — Inpatient Hospital Stay: Payer: No Typology Code available for payment source

## 2023-01-09 ENCOUNTER — Inpatient Hospital Stay: Payer: No Typology Code available for payment source

## 2023-01-09 VITALS — BP 112/70 | HR 64 | Temp 98.0°F | Resp 17 | Ht 69.0 in | Wt 245.0 lb

## 2023-01-09 DIAGNOSIS — D500 Iron deficiency anemia secondary to blood loss (chronic): Secondary | ICD-10-CM

## 2023-01-09 DIAGNOSIS — D509 Iron deficiency anemia, unspecified: Secondary | ICD-10-CM | POA: Diagnosis not present

## 2023-01-09 MED ORDER — SODIUM CHLORIDE 0.9 % IV SOLN: Freq: Once | INTRAVENOUS | Status: AC

## 2023-01-09 MED ORDER — SODIUM CHLORIDE 0.9 % IV SOLN
300.0000 mg | Freq: Once | INTRAVENOUS | Status: AC
  Administered 2023-01-09: 300 mg via INTRAVENOUS
  Filled 2023-01-09: qty 300

## 2023-01-09 NOTE — Patient Instructions (Signed)

## 2023-01-16 ENCOUNTER — Inpatient Hospital Stay: Payer: No Typology Code available for payment source

## 2023-01-16 VITALS — BP 114/71 | HR 66 | Temp 97.9°F | Resp 18

## 2023-01-16 DIAGNOSIS — D509 Iron deficiency anemia, unspecified: Secondary | ICD-10-CM | POA: Diagnosis not present

## 2023-01-16 DIAGNOSIS — D500 Iron deficiency anemia secondary to blood loss (chronic): Secondary | ICD-10-CM

## 2023-01-16 MED ORDER — SODIUM CHLORIDE 0.9 % IV SOLN: Freq: Once | INTRAVENOUS | Status: AC

## 2023-01-16 MED ORDER — SODIUM CHLORIDE 0.9 % IV SOLN
300.0000 mg | Freq: Once | INTRAVENOUS | Status: AC
  Administered 2023-01-16: 300 mg via INTRAVENOUS
  Filled 2023-01-16: qty 300

## 2023-01-16 NOTE — Patient Instructions (Signed)

## 2024-03-10 ENCOUNTER — Other Ambulatory Visit: Payer: Self-pay | Admitting: Obstetrics and Gynecology

## 2024-03-10 DIAGNOSIS — R928 Other abnormal and inconclusive findings on diagnostic imaging of breast: Secondary | ICD-10-CM

## 2024-03-24 ENCOUNTER — Ambulatory Visit
Admission: RE | Admit: 2024-03-24 | Discharge: 2024-03-24 | Disposition: A | Discharge status: Home / Self Care | Source: Ambulatory Visit | Attending: Obstetrics and Gynecology | Admitting: Obstetrics and Gynecology

## 2024-03-24 ENCOUNTER — Ambulatory Visit

## 2024-03-24 DIAGNOSIS — R928 Other abnormal and inconclusive findings on diagnostic imaging of breast: Secondary | ICD-10-CM

## 2024-04-21 ENCOUNTER — Other Ambulatory Visit: Payer: Self-pay | Admitting: Obstetrics and Gynecology

## 2024-04-21 DIAGNOSIS — Z803 Family history of malignant neoplasm of breast: Secondary | ICD-10-CM

## 2024-05-18 ENCOUNTER — Encounter: Payer: Self-pay | Admitting: Obstetrics and Gynecology

## 2024-05-20 ENCOUNTER — Encounter: Payer: Self-pay | Admitting: Obstetrics and Gynecology

## 2024-06-01 ENCOUNTER — Other Ambulatory Visit

## 2024-06-30 ENCOUNTER — Ambulatory Visit
Admission: RE | Admit: 2024-06-30 | Discharge: 2024-06-30 | Disposition: A | Discharge status: Home / Self Care | Source: Ambulatory Visit | Attending: Obstetrics and Gynecology | Admitting: Obstetrics and Gynecology

## 2024-06-30 DIAGNOSIS — Z803 Family history of malignant neoplasm of breast: Secondary | ICD-10-CM

## 2024-06-30 MED ORDER — GADOPICLENOL 0.5 MMOL/ML IV SOLN
10.0000 mL | Freq: Once | INTRAVENOUS | Status: AC | PRN
  Administered 2024-06-30: 10 mL via INTRAVENOUS
